# Patient Record
Sex: Male | Born: 1985 | Race: Black or African American | Hispanic: No | Marital: Single | Smoking: Former smoker
Health system: Southern US, Community
[De-identification: ages and names within clinical notes are randomized; demographics above are authoritative.]

## PROBLEM LIST (undated history)

## (undated) DIAGNOSIS — J45909 Unspecified asthma, uncomplicated: Secondary | ICD-10-CM

## (undated) DIAGNOSIS — A159 Respiratory tuberculosis unspecified: Secondary | ICD-10-CM

## (undated) HISTORY — PX: APPENDECTOMY: SHX54

## (undated) HISTORY — DX: Respiratory tuberculosis unspecified: A15.9

## (undated) HISTORY — DX: Unspecified asthma, uncomplicated: J45.909

---

## 2006-07-01 ENCOUNTER — Emergency Department (HOSPITAL_COMMUNITY): Admission: EM | Admit: 2006-07-01 | Discharge: 2006-07-01 | Payer: Self-pay | Admitting: Emergency Medicine

## 2011-12-26 ENCOUNTER — Ambulatory Visit
Admission: RE | Admit: 2011-12-26 | Discharge: 2011-12-26 | Disposition: A | Discharge status: Home / Self Care | Payer: No Typology Code available for payment source | Source: Ambulatory Visit | Attending: Infectious Diseases | Admitting: Infectious Diseases

## 2011-12-26 ENCOUNTER — Other Ambulatory Visit: Payer: Self-pay | Admitting: Infectious Diseases

## 2011-12-26 DIAGNOSIS — R7611 Nonspecific reaction to tuberculin skin test without active tuberculosis: Secondary | ICD-10-CM

## 2013-06-20 IMAGING — CR DG CHEST 1V
1 series · 1 of 1 positions shown · non-contrast
Comparison: None.

CLINICAL DATA: Positive TB skin test, smoking history

CHEST - 1 VIEW

[view not recorded]
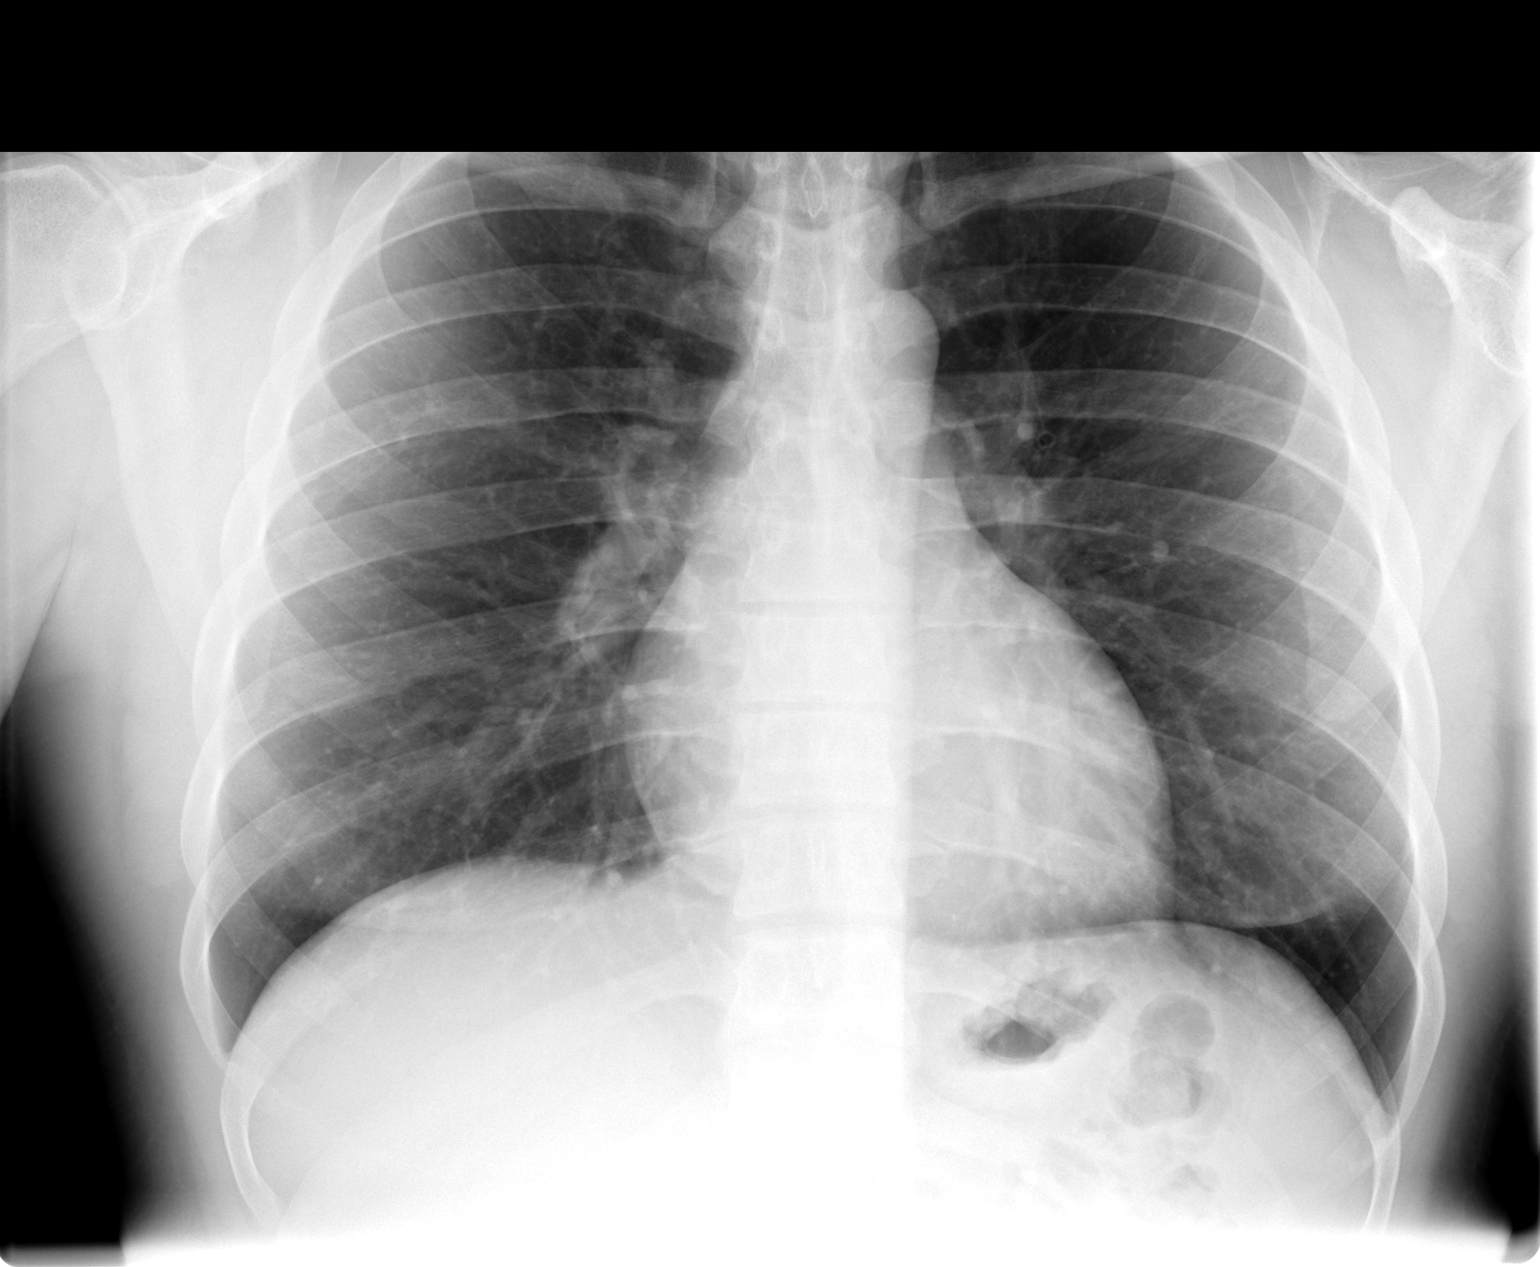

[1 of 1 positions shown; findings below may reference images not displayed]

FINDINGS: The lungs are clear.  No sequela of prior tuberculous
infection is seen.  The heart is within normal limits in size.  No
bony abnormality is noted.
IMPRESSION: No active lung disease.

## 2013-07-11 ENCOUNTER — Ambulatory Visit (INDEPENDENT_AMBULATORY_CARE_PROVIDER_SITE_OTHER): Payer: BC Managed Care – PPO | Admitting: Internal Medicine

## 2013-07-11 ENCOUNTER — Encounter: Payer: Self-pay | Admitting: Internal Medicine

## 2013-07-11 ENCOUNTER — Institutional Professional Consult (permissible substitution): Payer: Self-pay | Admitting: Pulmonary Disease

## 2013-07-11 VITALS — BP 136/82 | HR 75 | Temp 98.3°F | Ht 70.0 in | Wt 229.0 lb

## 2013-07-11 DIAGNOSIS — Z8611 Personal history of tuberculosis: Secondary | ICD-10-CM

## 2013-07-11 DIAGNOSIS — R942 Abnormal results of pulmonary function studies: Secondary | ICD-10-CM

## 2013-07-11 NOTE — Progress Notes (Signed)
   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 Pulmonary Consult    Referred per. Dr. Pati GalloJames Kramer for eval of abnormal spirometry. Pt reports needs cleanance for work for [employer].   Not limited by breathing from desired activities including full court basketball, running outside sev miles  No obvious other patterns in day to day or daytime variabilty or assoc chronic cough or cp or chest tightness, subjective wheeze overt sinus or hb symptoms. No unusual exp hx or h/o childhood pna/ asthma or knowledge of premature birth.  Sleeping ok without nocturnal  or early am exacerbation  of respiratory  c/o's or need for noct saba. Also denies any obvious fluctuation of symptoms with weather or environmental changes or other aggravating or alleviating factors except as outlined above   Current Medications, Allergies, Complete Past Medical History, Past Surgical History, Family History, and Social History were reviewed in [facility] electronic medical record.             Review of Systems  Constitutional: Negative for fever, chills, activity change, appetite change and unexpected weight change.  HENT: Negative for congestion, dental problem, postnasal drip, rhinorrhea, sneezing, sore throat, trouble swallowing and voice change.   Eyes: Negative for visual disturbance.  Respiratory: Negative for cough, choking and shortness of breath.   Cardiovascular: Negative for chest pain and leg swelling.  Gastrointestinal: Negative for nausea, vomiting and abdominal pain.  Genitourinary: Negative  for difficulty urinating.  Musculoskeletal: Negative for arthralgias.  Skin: Negative for rash.  Psychiatric/Behavioral: Negative for behavioral problems and confusion.       Objective:   Physical Exam  Wt Readings from Last 3 Encounters:  07/11/13 229 lb (103.874 kg)     HEENT: nl dentition, turbinates, and orophanx. Nl external ear canals without cough reflex   NECK :  without JVD/Nodes/TM/ nl carotid upstrokes bilaterally   LUNGS: no acc muscle use, clear to A and P bilaterally without cough on insp or exp maneuvers   CV:  RRR  no s3 or murmur or increase in P2, no edema   ABD:  soft and nontender with nl excursion in the supine position. No bruits or organomegaly, bowel sounds nl  MS:  warm without deformities, calf tenderness, cyanosis or clubbing  SKIN: warm and dry without lesions    NEURO:  alert, approp, no deficits     cxr 12/26/11  wnl cxr 07/01/13  Requested   Spirometry 07/11/2013  wnl including [id number]      Assessment & Plan:

## 2013-07-11 NOTE — Patient Instructions (Signed)
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

## 2013-07-12 DIAGNOSIS — R942 Abnormal results of pulmonary function studies: Secondary | ICD-10-CM | POA: Insufficient documentation

## 2013-07-12 DIAGNOSIS — R7611 Nonspecific reaction to tuberculin skin test without active tuberculosis: Secondary | ICD-10-CM | POA: Insufficient documentation

## 2013-07-12 NOTE — Assessment & Plan Note (Signed)
Pos PPD rx by health dept (Dr Roxan Hockeyobinson) complete 04/2012   No further f/u needed

## 2013-07-12 NOTE — Assessment & Plan Note (Signed)
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

## 2019-01-07 ENCOUNTER — Emergency Department (HOSPITAL_COMMUNITY): Admission: EM | Admit: 2019-01-07 | Discharge: 2019-01-07 | Discharge status: Absent without leave | Payer: Self-pay
